# Patient Record
Sex: Male | Born: 2000 | Race: White | Hispanic: No | Marital: Single | Smoking: Never smoker
Health system: Southern US, Community
[De-identification: ages and names within clinical notes are randomized; demographics above are authoritative.]

## PROBLEM LIST (undated history)

## (undated) DIAGNOSIS — F32A Depression, unspecified: Secondary | ICD-10-CM

## (undated) DIAGNOSIS — F419 Anxiety disorder, unspecified: Secondary | ICD-10-CM

## (undated) DIAGNOSIS — Z8781 Personal history of (healed) traumatic fracture: Secondary | ICD-10-CM

## (undated) DIAGNOSIS — F329 Major depressive disorder, single episode, unspecified: Secondary | ICD-10-CM

## (undated) DIAGNOSIS — Z8659 Personal history of other mental and behavioral disorders: Secondary | ICD-10-CM

## (undated) HISTORY — DX: Personal history of other mental and behavioral disorders: Z86.59

## (undated) HISTORY — DX: Depression, unspecified: F32.A

## (undated) HISTORY — DX: Anxiety disorder, unspecified: F41.9

## (undated) HISTORY — DX: Major depressive disorder, single episode, unspecified: F32.9

## (undated) HISTORY — DX: Personal history of (healed) traumatic fracture: Z87.81

---
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
## 2020-01-15 ENCOUNTER — Other Ambulatory Visit: Payer: Self-pay

## 2020-01-15 DIAGNOSIS — E349 Endocrine disorder, unspecified: Secondary | ICD-10-CM

## 2020-01-15 MED ORDER — "NEEDLE (DISP) 18G X 1"" MISC": 0 refills | Status: AC

## 2020-01-20 DIAGNOSIS — Z23 Encounter for immunization: Secondary | ICD-10-CM | POA: Diagnosis not present

## 2020-01-25 ENCOUNTER — Other Ambulatory Visit: Payer: Self-pay | Admitting: *Deleted

## 2020-01-25 DIAGNOSIS — E349 Endocrine disorder, unspecified: Secondary | ICD-10-CM

## 2020-01-25 MED ORDER — BUSPIRONE HCL 10 MG PO TABS: 10.0000 mg | ORAL_TABLET | Freq: Every day | ORAL | 0 refills | Status: DC

## 2020-04-21 ENCOUNTER — Other Ambulatory Visit: Payer: Self-pay

## 2020-04-21 DIAGNOSIS — E349 Endocrine disorder, unspecified: Secondary | ICD-10-CM

## 2020-04-21 MED ORDER — BUSPIRONE HCL 10 MG PO TABS: 10.0000 mg | ORAL_TABLET | Freq: Every day | ORAL | 0 refills | Status: DC

## 2020-07-18 ENCOUNTER — Other Ambulatory Visit: Payer: Self-pay

## 2020-07-18 DIAGNOSIS — E349 Endocrine disorder, unspecified: Secondary | ICD-10-CM

## 2020-07-18 MED ORDER — BUSPIRONE HCL 10 MG PO TABS: 10.0000 mg | ORAL_TABLET | Freq: Every day | ORAL | 0 refills | Status: DC

## 2020-08-05 ENCOUNTER — Other Ambulatory Visit: Payer: Self-pay | Admitting: Osteopathic Medicine

## 2020-08-05 DIAGNOSIS — E349 Endocrine disorder, unspecified: Secondary | ICD-10-CM

## 2020-08-05 DIAGNOSIS — Z789 Other specified health status: Secondary | ICD-10-CM

## 2020-08-08 NOTE — Telephone Encounter (Signed)
Please call patient: Refill sent for testosterone, he is overdue for blood work, orders are in for routine labs, please have him either come in to see our phlebotomist or can go downstairs to [facility]

## 2020-08-09 NOTE — Telephone Encounter (Signed)
Task completed. Patient has been updated regarding rx refill and provider's note. Patient informed of lab hours/schedule.

## 2020-09-16 DIAGNOSIS — E349 Endocrine disorder, unspecified: Secondary | ICD-10-CM | POA: Diagnosis not present

## 2020-09-16 DIAGNOSIS — Z789 Other specified health status: Secondary | ICD-10-CM | POA: Diagnosis not present

## 2020-09-16 LAB — COMPLETE METABOLIC PANEL WITH GFR
AG Ratio: 2 (calc) (ref 1.0–2.5)
ALT: 9 U/L (ref 8–46)
AST: 10 U/L — ABNORMAL LOW (ref 12–32)
Albumin: 4.7 g/dL (ref 3.6–5.1)
Alkaline phosphatase (APISO): 61 U/L (ref 46–169)
BUN: 15 mg/dL (ref 7–20)
CO2: 28 mmol/L (ref 20–32)
Calcium: 9.6 mg/dL (ref 8.9–10.4)
Chloride: 105 mmol/L (ref 98–110)
Creat: 0.97 mg/dL (ref 0.60–1.24)
Globulin: 2.3 g/dL (calc) (ref 2.1–3.5)
Glucose, Bld: 74 mg/dL (ref 65–99)
Potassium: 4.2 mmol/L (ref 3.8–5.1)
Sodium: 139 mmol/L (ref 135–146)
Total Bilirubin: 1.5 mg/dL — ABNORMAL HIGH (ref 0.2–1.1)
Total Protein: 7 g/dL (ref 6.3–8.2)
eGFR: 115 mL/min/{1.73_m2} (ref >=60)

## 2020-09-16 LAB — CBC
HCT: 47.4 % (ref 38.5–50.0)
Hemoglobin: 16.1 g/dL (ref 13.2–17.1)
MCH: 31.1 pg (ref 27.0–33.0)
MCHC: 34 g/dL (ref 32.0–36.0)
MCV: 91.7 fL (ref 80.0–100.0)
MPV: 10.8 fL (ref 7.5–12.5)
Platelets: 221 10*3/uL (ref 140–400)
RBC: 5.17 10*6/uL (ref 4.20–5.80)
RDW: 11.8 % (ref 11.0–15.0)
WBC: 4.1 10*3/uL (ref 3.8–10.8)

## 2020-09-16 LAB — LIPID PANEL
Cholesterol: 188 mg/dL — ABNORMAL HIGH (ref <170)
HDL: 52 mg/dL (ref >45)
LDL Cholesterol (Calc): 119 mg/dL (calc) — ABNORMAL HIGH (ref <110)
Non-HDL Cholesterol (Calc): 136 mg/dL (calc) — ABNORMAL HIGH (ref <120)
Total CHOL/HDL Ratio: 3.6 (calc) (ref <5.0)
Triglycerides: 71 mg/dL (ref <90)

## 2020-09-16 LAB — TESTOSTERONE: Testosterone: 65 ng/dL — ABNORMAL LOW (ref 250–827)

## 2020-09-23 ENCOUNTER — Other Ambulatory Visit: Payer: Self-pay

## 2020-09-23 DIAGNOSIS — Z5181 Encounter for therapeutic drug level monitoring: Secondary | ICD-10-CM

## 2020-09-23 DIAGNOSIS — Z79890 Hormone replacement therapy (postmenopausal): Secondary | ICD-10-CM

## 2020-10-10 DIAGNOSIS — Z5181 Encounter for therapeutic drug level monitoring: Secondary | ICD-10-CM | POA: Diagnosis not present

## 2020-10-10 DIAGNOSIS — Z79890 Hormone replacement therapy (postmenopausal): Secondary | ICD-10-CM | POA: Diagnosis not present

## 2020-10-11 ENCOUNTER — Other Ambulatory Visit: Payer: Self-pay | Admitting: Osteopathic Medicine

## 2020-10-11 DIAGNOSIS — E349 Endocrine disorder, unspecified: Secondary | ICD-10-CM

## 2020-10-11 LAB — TESTOSTERONE: Testosterone: 552 ng/dL (ref 250–827)

## 2020-10-11 MED ORDER — TESTOSTERONE CYPIONATE 200 MG/ML IM SOLN: INTRAMUSCULAR | 3 refills | Status: DC

## 2020-10-13 ENCOUNTER — Ambulatory Visit (INDEPENDENT_AMBULATORY_CARE_PROVIDER_SITE_OTHER): Payer: BC Managed Care – PPO | Admitting: Osteopathic Medicine

## 2020-10-13 VITALS — BP 120/74 | HR 69 | Temp 98.1°F | Wt 138.1 lb

## 2020-10-13 DIAGNOSIS — Z789 Other specified health status: Secondary | ICD-10-CM | POA: Diagnosis not present

## 2020-10-13 DIAGNOSIS — Z0000 Encounter for general adult medical examination without abnormal findings: Secondary | ICD-10-CM

## 2020-10-13 NOTE — Progress Notes (Signed)
0

## 2020-10-13 NOTE — Progress Notes (Signed)
James Glenn is a 20 y.o. adult who presents to  [place] at [facility]  today, 10/13/20, seeking care for the following:  Annual physical  Monitor on gender-affirming hormone Rx - doing well      ASSESSMENT & PLAN with other pertinent findings:  The primary encounter diagnosis was Annual physical exam. 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   1. Annual physical exam See below  2. 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 OK to get labs only in 6 mos: monitor CBC, goal T levels 400-800    Patient Instructions  General Preventive Care Most recent routine screening labs: done.  Blood pressure goal 130/80 or less.  Tobacco: don't!  Alcohol: responsible moderation is ok for adults 21 and over - if you have concerns about alcohol intake, please talk to me!  Exercise: as tolerated to reduce risk of cardiovascular disease and diabetes. Strength training will also prevent osteoporosis.  Mental health: if need for mental health care (medicines, counseling, other), or concerns about moods, please let me know!  Sexual / Reproductive health: if need for STD testing, or if concerns with libido/pain problems, please let me know! If you need to discuss family planning, please let me know!  Advanced Directive: Living Will and/or Healthcare Power of Attorney recommended for all adults, regardless of age or health.  Vaccines Flu vaccine: for almost everyone, every fall.  Shingles vaccine: after age 56.  Pneumonia vaccines: after age 43 Tetanus booster: every 10 years - due 2024 HPV vaccine: all done! COVID vaccine: THANKS for getting your vaccine! :) Cancer screenings  Colon cancer screening: for everyone age 44.  Breast cancer screening: mammogram not  needed after top surgery. If you notice concerning chest symptoms, please let us know!  Cervical cancer screening: Pap starting age 45 and repeat every 3 years if normal. Lung cancer screening: not needed for non-smokers Infection screenings  HIV: recommended screening at least once age 50-65, more often as needed. Gonorrhea/Chlamydia: screening as needed Hepatitis C: recommended once for everyone age 85-02 TB: certain at-risk populations Other Bone Density Test: recommended at age 67 Abdominal Aortic Aneurysm screening not needed for non-smokers   No orders of the defined types were placed in this encounter.   No orders of the defined types were placed in this encounter.    See below for relevant physical exam findings  See below for recent lab and imaging results reviewed  Medications, allergies, PMH, PSH, SocH, Bray reviewed below    Follow-up instructions: Return in about 1 year (around 10/13/2021) for [facility] (OR SOONER IF NEEDED), MYCHART SET TO ALERT YOU TO CALL TO SCHEDULE.                                        Exam:  BP 120/74 (BP Location: Left Arm, Patient Position: Sitting, Cuff Size: Normal)   Pulse 69   Temp 98.1 F (36.7 C) (Oral)   Wt 138 lb 1.9 oz (62.7 kg)   BMI 21.00 kg/m  Constitutional: VS see above. General Appearance: alert, well-developed, well-nourished, NAD Neck: No masses, trachea midline.  Respiratory: Normal respiratory effort. no wheeze, no rhonchi, no rales Cardiovascular: S1/S2 normal, no murmur, no rub/gallop auscultated.  RRR.  Musculoskeletal: Gait normal. Symmetric and independent movement of all extremities Abdominal: non-tender, non-distended, no appreciable organomegaly, neg Murphy's, BS WNLx4 Neurological: Normal balance/coordination. No tremor. Skin: warm, dry, intact.  Psychiatric: Normal judgment/insight. Normal mood and affect. Oriented x3.   Current Meds  Medication Sig    busPIRone (BUSPAR) 10 MG tablet Take 1 tablet (10 mg total) by mouth daily.   NEEDLE, DISP, 18 G 18G X 1" MISC To draw testosterone cypionate   NEEDLE, DISP, 23 G (B-D HYPODERMIC NEEDLE 23GX1.5") 23G X 1-1/2" MISC For IM use with testosterone   Syringe, Disposable, (5-6CC SYRINGE) 6 ML MISC For use w/ IM testosterone   testosterone cypionate (DEPOTESTOSTERONE CYPIONATE) 200 MG/ML injection INJECT 0.5 ML IN THE MUSCLE EVERY 14 DAYS    No Known Allergies  Patient Active Problem List   Diagnosis Date Noted   Elevated blood pressure reading 02/05/2018   Encounter for monitoring testosterone replacement therapy 04/29/2017   Adolescent idiopathic scoliosis of thoracolumbar region 04/29/2017   Endocrine disorder, unspecified 03/25/2017   Acne vulgaris 03/25/2017   Anxiety disorder 03/25/2017    Family History  Problem Relation Age of Onset   Hypertension Father    Hyperlipidemia Maternal Grandfather    Hyperlipidemia Paternal Grandmother     Social History   Tobacco Use  Smoking Status Never  Smokeless Tobacco Never    Past Surgical History:  Procedure Laterality Date   TOTAL MASTECTOMY  02/2017   gender confirmation surgery    Immunization History  Administered Date(s) Administered   DTaP 01/09/2001, 01/09/2001, 03/07/2001, 03/07/2001, 05/06/2001, 05/06/2001, 03/03/2002, 11/07/2005, 11/07/2005   HPV 9-valent 05/10/2015, 05/10/2015   HPV Quadrivalent 01/07/2014   Hepatitis A 11/07/2005, 05/13/2006   Hepatitis A, Ped/Adol-2 Dose 11/07/2005, 05/13/2006   Hepatitis B 2000[birth year]04[birth year]29, 12/10/2000, 07/29/2001   Hepatitis B, ped/adol September 05, 2000, 12/10/2000, 07/29/2001   HiB (PRP-OMP) 01/09/2001, 01/09/2001, 03/07/2001, 03/07/2001, 05/06/2001, 05/06/2001, 11/03/2001, 11/03/2001   Hpv-Unspecified 01/07/2014, 05/10/2015   IPV 01/09/2001, 01/09/2001, 03/07/2001, 03/07/2001, 07/29/2001, 07/29/2001, 05/13/2006, 05/13/2006   Influenza Inj Mdck Quad Pf 12/15/2016, 10/27/2017   MMR  03/03/2002, 03/03/2002, 05/13/2006, 05/13/2006   Meningococcal B, OMV 10/28/2018   Meningococcal Conjugate 07/14/2012, 07/14/2012   Meningococcal Mcv4o 10/24/2018   PFIZER(Purple Top)SARS-COV-2 Vaccination 06/10/2019, 10/01/2019   Pneumococcal Conjugate-13 01/09/2001, 01/09/2001, 03/07/2001, 03/07/2001, 05/06/2001, 05/06/2001, 03/03/2002, 03/03/2002   Td 07/14/2012   Tdap 07/14/2012   Varicella 03/03/2002, 03/03/2002, 05/13/2006   Zoster, Live 05/13/2006    Recent Results (from the past 2160 hour(s))  CBC     Status: None   Collection Time: 09/16/20  9:31 AM  Result Value Ref Range   WBC 4.1 3.8 - 10.8 Thousand/uL   RBC 5.17 4.20 - 5.80 Million/uL   Hemoglobin 16.1 13.2 - 17.1 g/dL   HCT 47.4 38.5 - 50.0 %   MCV 91.7 80.0 - 100.0 fL   MCH 31.1 27.0 - 33.0 pg   MCHC 34.0 32.0 - 36.0 g/dL   RDW 11.8 11.0 - 15.0 %   Platelets 221 140 - 400 Thousand/uL   MPV 10.8 7.5 - 12.5 fL  COMPLETE METABOLIC PANEL WITH GFR     Status: Abnormal   Collection Time: 09/16/20  9:31 AM  Result Value Ref Range   Glucose, Bld 74 65 - 99 mg/dL    Comment: .            Fasting reference interval .    BUN 15 7 - 20 mg/dL   Creat 0.97 0.60 - 1.24 mg/dL   eGFR 115 >  OR = 60 mL/min/1.84m    Comment: The eGFR is based on the CKD-EPI 2021 equation. To calculate  the new eGFR from a previous Creatinine or Cystatin C result, go to https://www.kidney.org/professionals/ kdoqi/gfr%5Fcalculator    BUN/Creatinine Ratio NOT APPLICABLE 6 - 22 (calc)   Sodium 139 135 - 146 mmol/L   Potassium 4.2 3.8 - 5.1 mmol/L   Chloride 105 98 - 110 mmol/L   CO2 28 20 - 32 mmol/L   Calcium 9.6 8.9 - 10.4 mg/dL   Total Protein 7.0 6.3 - 8.2 g/dL   Albumin 4.7 3.6 - 5.1 g/dL   Globulin 2.3 2.1 - 3.5 g/dL (calc)   AG Ratio 2.0 1.0 - 2.5 (calc)   Total Bilirubin 1.5 (H) 0.2 - 1.1 mg/dL   Alkaline phosphatase (APISO) 61 46 - 169 U/L   AST 10 (L) 12 - 32 U/L   ALT 9 8 - 46 U/L  Lipid panel     Status: Abnormal    Collection Time: 09/16/20  9:31 AM  Result Value Ref Range   Cholesterol 188 (H) <170 mg/dL   HDL 52 >45 mg/dL   Triglycerides 71 <90 mg/dL   LDL Cholesterol (Calc) 119 (H) <110 mg/dL (calc)    Comment: LDL-C is now calculated using the Martin-Hopkins  calculation, which is a validated novel method providing  better accuracy than the Friedewald equation in the  estimation of LDL-C.  MCresenciano Genreet al. JAnnamaria Helling [id number];757(97: [phone]  (http://education.[employer].com/faq/FAQ164)    Total CHOL/HDL Ratio 3.6 <5.0 (calc)   Non-HDL Cholesterol (Calc) 136 (H) <120 mg/dL (calc)    Comment: For patients with diabetes plus 1 major ASCVD risk  factor, treating to a non-HDL-C goal of <100 mg/dL  (LDL-C of <70 mg/dL) is considered a therapeutic  option.   Testosterone     Status: Abnormal   Collection Time: 09/16/20  9:31 AM  Result Value Ref Range   Testosterone 65 (L) 250 - 827 ng/dL    Comment: In hypogonadal males, Testosterone, Total, LC/MS/MS, is the recommended assay due to the diminished accuracy of immunoassay at levels below 250 ng/dL. This test code [phone] must be collected in a red-top tube with no gel.    Testosterone     Status: None   Collection Time: 10/10/20 12:00 AM  Result Value Ref Range   Testosterone 552 250 - 827 ng/dL    No results found.     All questions at time of visit were answered - patient instructed to contact office with any additional concerns or updates. ER/RTC precautions were reviewed with the patient as applicable.   Please note: manual typing as well as voice recognition software may have been used to produce this document - typos may escape review. Please contact Dr. ASheppard Coilfor any needed clarifications.

## 2020-10-13 NOTE — Patient Instructions (Addendum)
General Preventive Care Most recent routine screening labs: done.  Blood pressure goal 130/80 or less.  Tobacco: don't!  Alcohol: responsible moderation is ok for adults 21 and over - if you have concerns about alcohol intake, please talk to me!  Exercise: as tolerated to reduce risk of cardiovascular disease and diabetes. Strength training will also prevent osteoporosis.  Mental health: if need for mental health care (medicines, counseling, other), or concerns about moods, please let me know!  Sexual / Reproductive health: if need for STD testing, or if concerns with libido/pain problems, please let me know! If you need to discuss family planning, please let me know!  Advanced Directive: Living Will and/or Healthcare Power of Attorney recommended for all adults, regardless of age or health.  Vaccines Flu vaccine: for almost everyone, every fall.  Shingles vaccine: after age 75.  Pneumonia vaccines: after age 76 Tetanus booster: every 10 years - due 2024 HPV vaccine: all done! COVID vaccine: THANKS for getting your vaccine! :) Cancer screenings  Colon cancer screening: for everyone age 60.  Breast cancer screening: mammogram not needed after top surgery. If you notice concerning chest symptoms, please let us know!  Cervical cancer screening: Pap starting age 67 and repeat every 3 years if normal. Lung cancer screening: not needed for non-smokers Infection screenings  HIV: recommended screening at least once age 75-65, more often as needed. Gonorrhea/Chlamydia: screening as needed Hepatitis C: recommended once for everyone age 58-75 TB: certain at-risk populations Other Bone Density Test: recommended at age 85 Abdominal Aortic Aneurysm screening not needed for non-smokers

## 2020-10-14 ENCOUNTER — Encounter: Payer: Self-pay | Admitting: Osteopathic Medicine

## 2021-05-08 ENCOUNTER — Other Ambulatory Visit: Payer: Self-pay

## 2021-05-08 DIAGNOSIS — E349 Endocrine disorder, unspecified: Secondary | ICD-10-CM

## 2021-05-08 MED ORDER — TESTOSTERONE CYPIONATE 200 MG/ML IM SOLN: INTRAMUSCULAR | 1 refills | Status: DC

## 2021-05-08 NOTE — Telephone Encounter (Signed)
Routing to covering provider. [facility] pharmacy requesting med refill for testosterone.  ?

## 2021-07-09 NOTE — Progress Notes (Signed)
?  HPI with pertinent ROS:  ? ?CC: transfer of care, labs ? ?HPI: ?Pleasant 21 year old FTM transgender patient presenting today to transfer care to a new PCP. Has been taking testosterone 100mg  IM every 2 weeks. Been on this for several years. Recently missed a couple of doses due to running out of his supply. With the recent doses missed, menses restarted late last year but has now stopped again.  Has been back on testosterone since late December and is doing very well overall.  Due for labs. ? ?Has questions about recommendations for Pap smear and notes that he has been haunted by this topic for a while. ? ?I reviewed the past medical history, family history, social history, surgical history, and allergies today and no changes were needed.  Please see the problem list section below in epic for further details. ? ? ?Physical exam:  ? ?General: Well Developed, well nourished, and in no acute distress.  ?Neuro: Alert and oriented x3.  ?HEENT: Normocephalic, atraumatic.  ?Skin: Warm and dry. ?Cardiac: Regular rate and rhythm, no murmurs rubs or gallops, no lower extremity edema.  ?Respiratory: Clear to auscultation bilaterally. Not using accessory muscles, speaking in full sentences. ? ?Impression and Recommendations:   ? ?1. 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 Endocrine disorder, unspecified ?Checking labs as below.  Recommend coming in on Friday, 07/14/2021 to make sure we have an accurate evaluation of testosterone.  For now, continue testosterone 100 mg every 2 weeks as prescribed.  Discussed the recommendations for cervical cancer screening in transgender patients.  Described the process and allowed patient to evaluate speculum and equipment used.  All questions answered. ?- CBC ?- COMPLETE METABOLIC PANEL WITH GFR ?- Testosterone ?- Lipid panel ?- testosterone cypionate (DEPOTESTOSTERONE CYPIONATE) 200 MG/ML injection; INJECT 0.5 ML IN THE  MUSCLE EVERY 14 DAYS  Dispense: 4 mL; Refill: 0 ? ?Return in about 6 months (around 01/10/2022) for HRT follow up. ?___________________________________________ ?01/12/2022, DNP, APRN, FNP-BC ?Primary Care and Sports Medicine ?[facility] Thayer Ohm ?

## 2021-07-10 ENCOUNTER — Ambulatory Visit: Payer: BC Managed Care – PPO | Admitting: Medical-Surgical

## 2021-07-10 ENCOUNTER — Encounter: Payer: Self-pay | Admitting: Medical-Surgical

## 2021-07-10 VITALS — BP 119/83 | HR 66 | Resp 16 | Ht 68.0 in | Wt 138.0 lb

## 2021-07-10 DIAGNOSIS — E349 Endocrine disorder, unspecified: Secondary | ICD-10-CM | POA: Diagnosis not present

## 2021-07-10 DIAGNOSIS — Z789 Other specified health status: Secondary | ICD-10-CM | POA: Diagnosis not present

## 2021-07-10 MED ORDER — TESTOSTERONE CYPIONATE 200 MG/ML IM SOLN: INTRAMUSCULAR | 0 refills | Status: DC

## 2021-07-14 DIAGNOSIS — Z789 Other specified health status: Secondary | ICD-10-CM | POA: Diagnosis not present

## 2021-07-15 LAB — COMPLETE METABOLIC PANEL WITH GFR
AG Ratio: 2.2 (calc) (ref 1.0–2.5)
ALT: 9 U/L (ref 9–46)
AST: 9 U/L — ABNORMAL LOW (ref 10–40)
Albumin: 4.8 g/dL (ref 3.6–5.1)
Alkaline phosphatase (APISO): 57 U/L (ref 36–130)
BUN: 13 mg/dL (ref 7–25)
CO2: 27 mmol/L (ref 20–32)
Calcium: 9.8 mg/dL (ref 8.6–10.3)
Chloride: 103 mmol/L (ref 98–110)
Creat: 0.94 mg/dL (ref 0.60–1.24)
Globulin: 2.2 g/dL (calc) (ref 1.9–3.7)
Glucose, Bld: 79 mg/dL (ref 65–99)
Potassium: 4.5 mmol/L (ref 3.5–5.3)
Sodium: 140 mmol/L (ref 135–146)
Total Bilirubin: 1.1 mg/dL (ref 0.2–1.2)
Total Protein: 7 g/dL (ref 6.1–8.1)
eGFR: 119 mL/min/{1.73_m2} (ref >=60)

## 2021-07-15 LAB — LIPID PANEL
Cholesterol: 199 mg/dL (ref <200)
HDL: 58 mg/dL (ref >=40)
LDL Cholesterol (Calc): 124 mg/dL (calc) — ABNORMAL HIGH
Non-HDL Cholesterol (Calc): 141 mg/dL (calc) — ABNORMAL HIGH (ref <130)
Total CHOL/HDL Ratio: 3.4 (calc) (ref <5.0)
Triglycerides: 74 mg/dL (ref <150)

## 2021-07-15 LAB — CBC
HCT: 46.9 % (ref 38.5–50.0)
Hemoglobin: 16.1 g/dL (ref 13.2–17.1)
MCH: 31.6 pg (ref 27.0–33.0)
MCHC: 34.3 g/dL (ref 32.0–36.0)
MCV: 92 fL (ref 80.0–100.0)
MPV: 10.6 fL (ref 7.5–12.5)
Platelets: 243 10*3/uL (ref 140–400)
RBC: 5.1 10*6/uL (ref 4.20–5.80)
RDW: 12.1 % (ref 11.0–15.0)
WBC: 5.2 10*3/uL (ref 3.8–10.8)

## 2021-07-15 LAB — TESTOSTERONE: Testosterone: 386 ng/dL (ref 250–827)

## 2021-11-11 ENCOUNTER — Other Ambulatory Visit: Payer: Self-pay | Admitting: Medical-Surgical

## 2021-11-11 DIAGNOSIS — E349 Endocrine disorder, unspecified: Secondary | ICD-10-CM

## 2021-11-13 ENCOUNTER — Other Ambulatory Visit: Payer: Self-pay

## 2021-11-13 DIAGNOSIS — E349 Endocrine disorder, unspecified: Secondary | ICD-10-CM

## 2021-11-13 MED ORDER — TESTOSTERONE CYPIONATE 200 MG/ML IM SOLN: INTRAMUSCULAR | 0 refills | Status: DC

## 2021-11-23 IMAGING — DX DG SCOLIOSIS EVAL COMPLETE SPINE 2-3V
2 series · 8 of 8 positions shown · non-contrast
Comparison: 10/24/2018

CLINICAL DATA: Follow-up scoliosis

EXAM:
DG SCOLIOSIS EVAL COMPLETE SPINE 2-3V

[Series 1: whole body ap · 0.14mm/px · 4 of 4 slices shown]
[im 1/4]
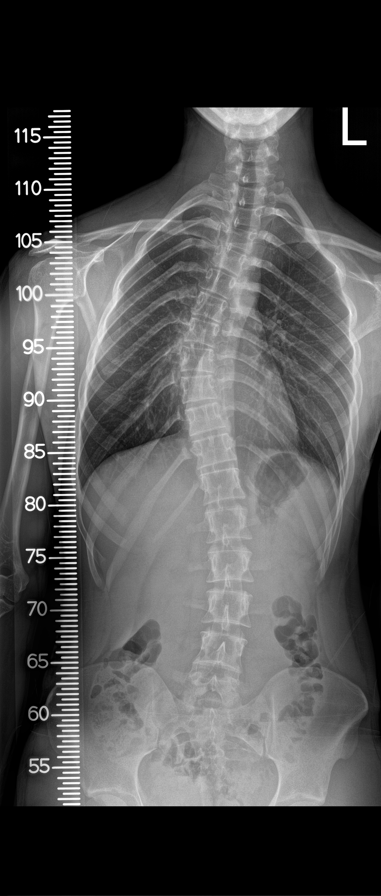
[im 2/4]
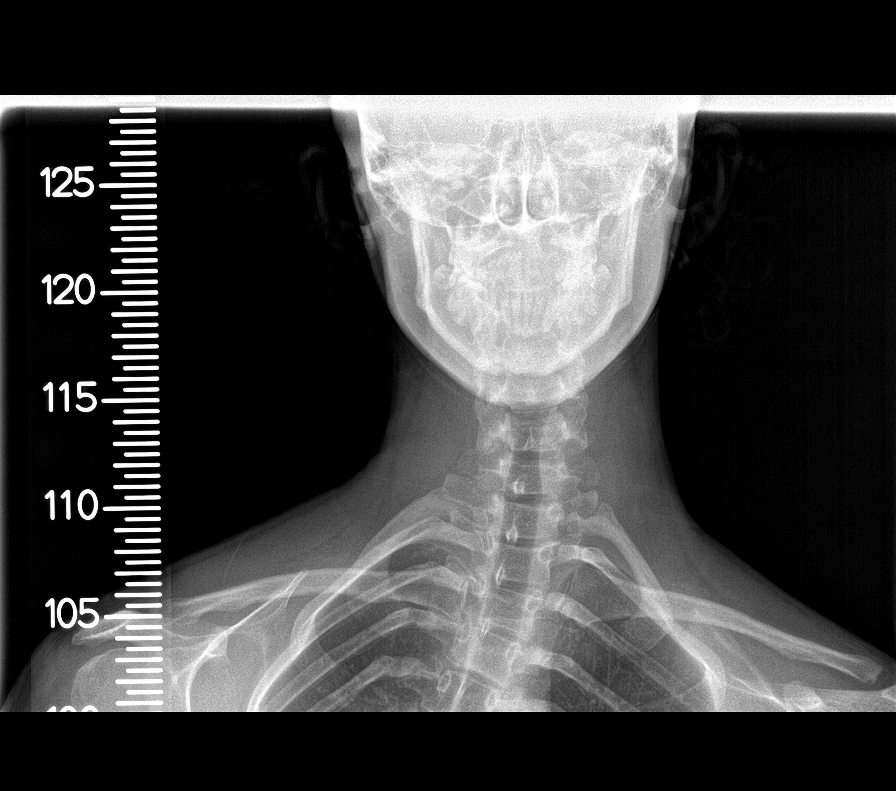
[im 3/4]
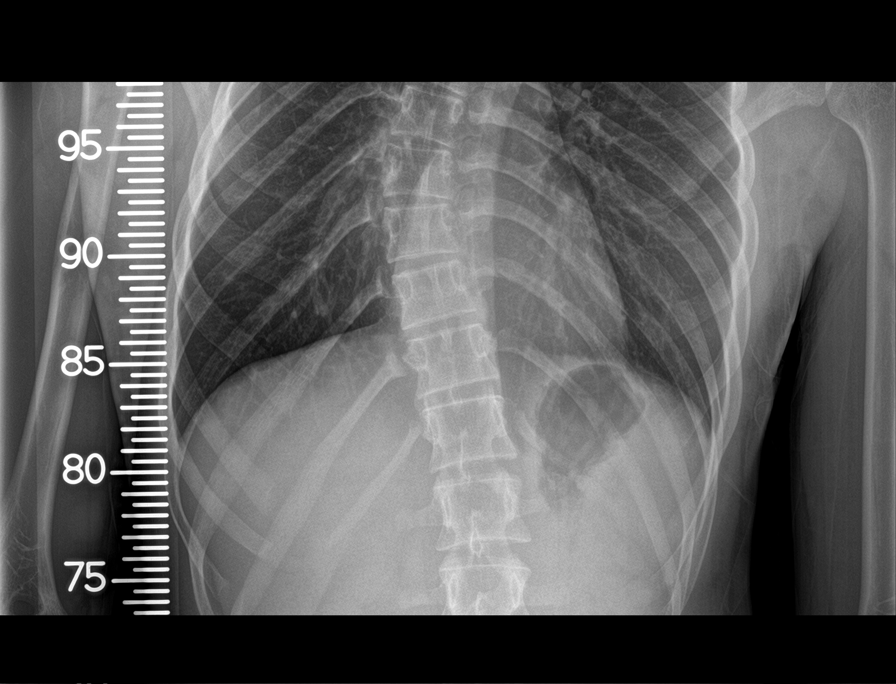
[im 4/4]
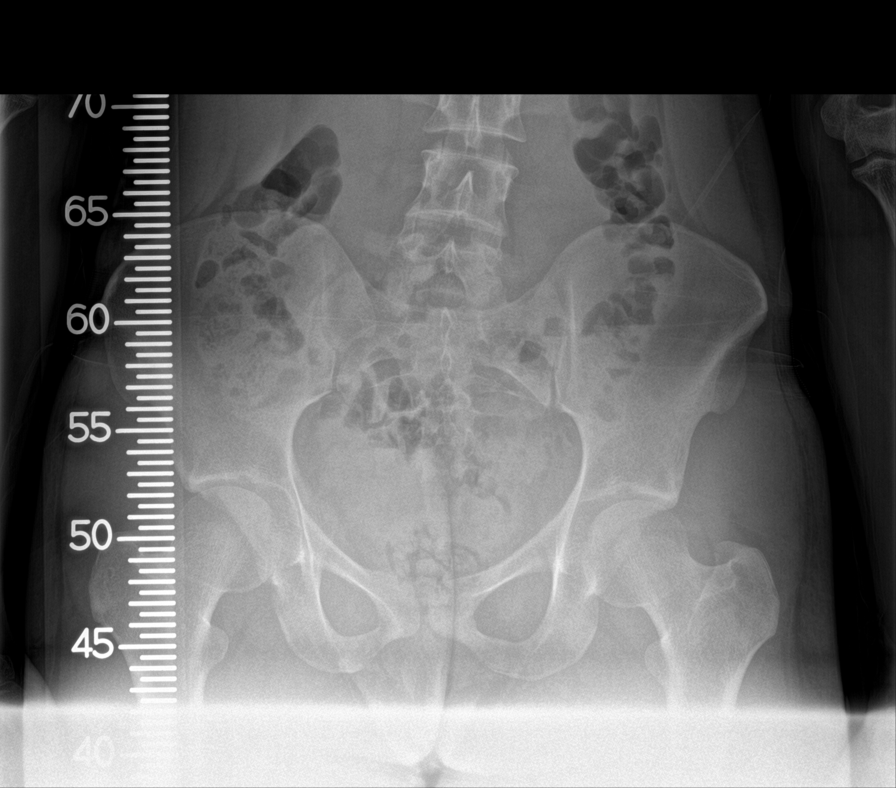

[Series 2: whole body lat · 0.14mm/px · 4 of 4 slices shown]
[im 1/4]
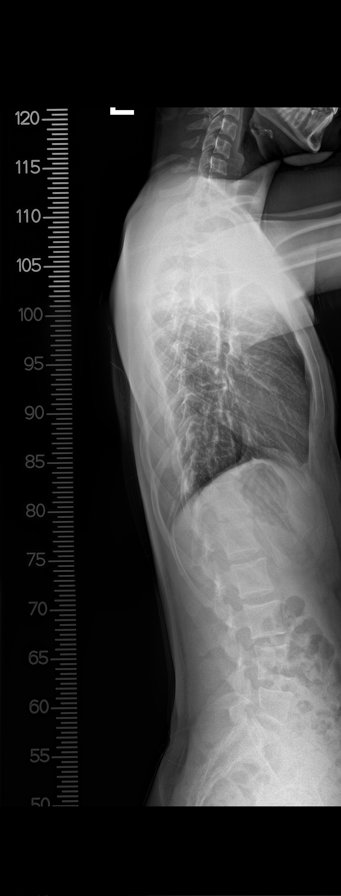
[im 2/4]
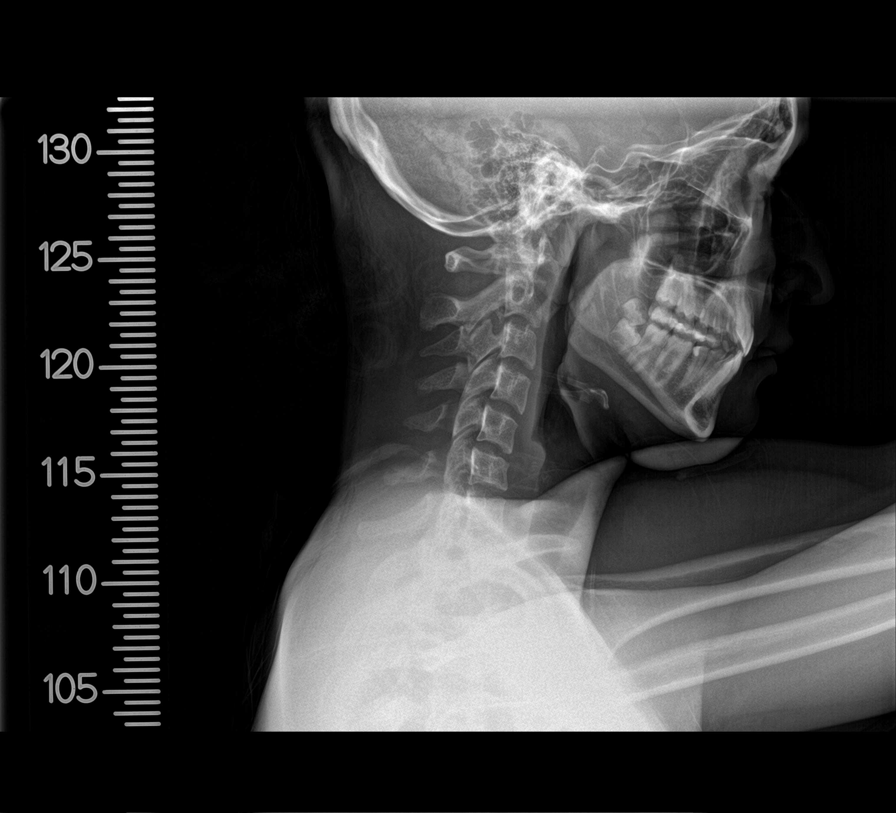
[im 3/4]
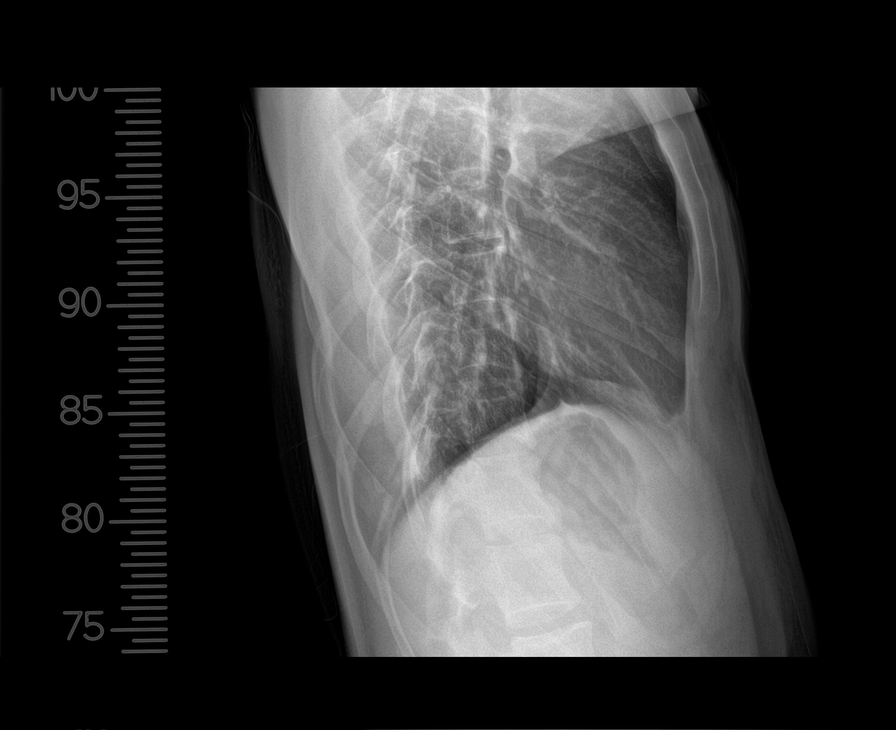
[im 4/4]
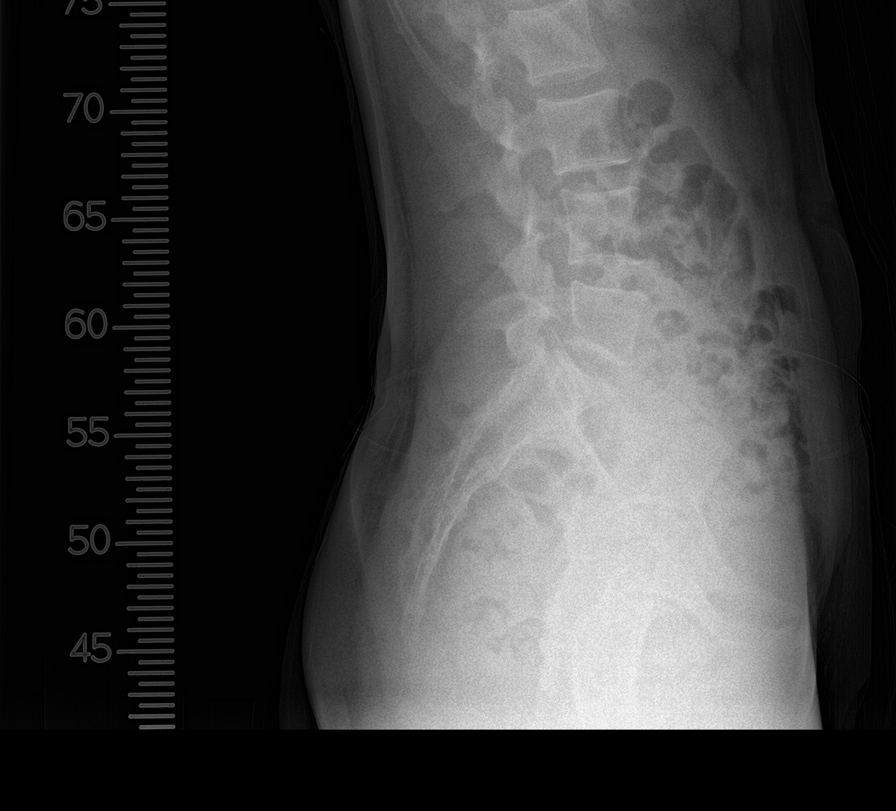

[8 of 8 positions shown; findings below may reference images not displayed]

FINDINGS: 37 degree scoliosis of the thoracic spine, apex right, from superior
endplate of T4 to the inferior endplate of T11, previously 32
degrees when measured in a similar fashion. 24 degree scoliosis of
the thoracolumbar spine, apex left, as measured from superior
endplate of T11 to inferior endplate of L4, previously 26 degrees
when measured in a similar fashion. Twelve rib pairs. No congenital
vertebral anomalies. Patient is Risser class 5. Sagittal images
demonstrate mild reversal of lower cervical lordosis, straightening
of the thoracic spine, and mild lumbar lordosis. Vertebral body
heights and disc spaces appear normal.
IMPRESSION: Thoracolumbar scoliosis as above.

## 2022-01-14 NOTE — Progress Notes (Unsigned)
   Established Patient Office Visit  Subjective   Patient ID: Corky Blumstein, transgender male   DOB: 2001-01-18 Age: 21 y.o. MRN: [id number]   No chief complaint on file.   HPI    Objective:    There were no vitals filed for this visit.  Physical Exam   No results found for this or any previous visit (from the past 24 hour(s)).   {Labs (Optional):23779}  The ASCVD Risk score (Arnett DK, et al., 2019) failed to calculate for the following reasons:   The 2019 ASCVD risk score is only valid for ages 45 to 91   Assessment & Plan:   No problem-specific Assessment & Plan notes found for this encounter.   No follow-ups on file.  ___________________________________________ Th[facility] Ohm, DNP, APRN, FNP-BC Primary Care and Sports Medicine [facility] [place]

## 2022-01-15 ENCOUNTER — Ambulatory Visit: Payer: BC Managed Care – PPO | Admitting: Medical-Surgical

## 2022-01-15 ENCOUNTER — Encounter: Payer: Self-pay | Admitting: Medical-Surgical

## 2022-01-15 VITALS — BP 100/69 | HR 88 | Ht 68.0 in | Wt 138.5 lb

## 2022-01-15 DIAGNOSIS — Z789 Other specified health status: Secondary | ICD-10-CM | POA: Diagnosis not present

## 2022-01-15 DIAGNOSIS — Z23 Encounter for immunization: Secondary | ICD-10-CM

## 2022-01-16 ENCOUNTER — Telehealth: Payer: Self-pay | Admitting: Medical-Surgical

## 2022-01-16 LAB — CBC
HCT: 45.1 % (ref 38.5–50.0)
Hemoglobin: 15.7 g/dL (ref 13.2–17.1)
MCH: 30.9 pg (ref 27.0–33.0)
MCHC: 34.8 g/dL (ref 32.0–36.0)
MCV: 88.8 fL (ref 80.0–100.0)
MPV: 10.3 fL (ref 7.5–12.5)
Platelets: 273 10*3/uL (ref 140–400)
RBC: 5.08 10*6/uL (ref 4.20–5.80)
RDW: 12 % (ref 11.0–15.0)
WBC: 7.9 10*3/uL (ref 3.8–10.8)

## 2022-01-16 LAB — TESTOSTERONE: Testosterone: 420 ng/dL (ref 250–827)

## 2022-01-16 NOTE — Telephone Encounter (Signed)
Patient has been scheduled for his 63-month follow-up with the incorrect provider.  Please reschedule with his PCP. Thanks, Ander Slade

## 2022-01-16 NOTE — Telephone Encounter (Signed)
Rescheduled patient with PCP for same day at a later time. Katha Hamming

## 2022-03-28 ENCOUNTER — Other Ambulatory Visit: Payer: Self-pay | Admitting: Medical-Surgical

## 2022-03-28 DIAGNOSIS — E349 Endocrine disorder, unspecified: Secondary | ICD-10-CM

## 2022-03-28 NOTE — Telephone Encounter (Signed)
Last OV: 01/15/22 Next OV:07/16/22 Last labs: 01/15/22 (wnl) Last RF: 11/13/21

## 2022-05-08 IMAGING — DX DG SCOLIOSIS EVAL COMPLETE SPINE 2-3V
2 series · 8 of 8 positions shown · non-contrast
Comparison: 04/24/2019

CLINICAL DATA: Scoliosis evaluation.

EXAM:
DG SCOLIOSIS EVAL COMPLETE SPINE 2-3V

[Series 2: whole body lat · 0.14mm/px · 4 of 4 slices shown]
[im 1/4]
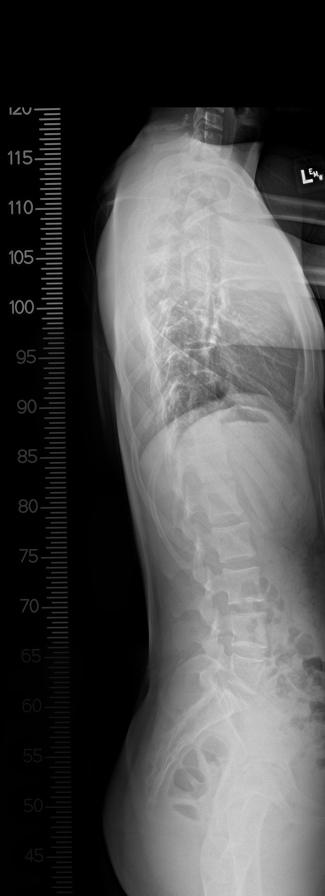
[im 2/4]
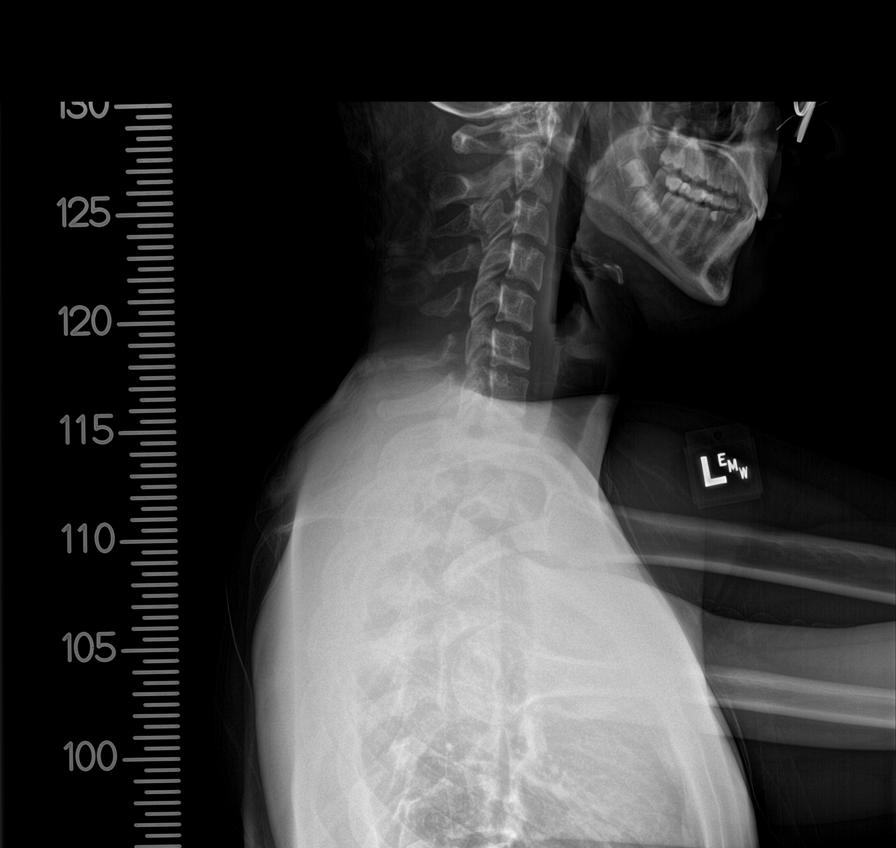
[im 3/4]
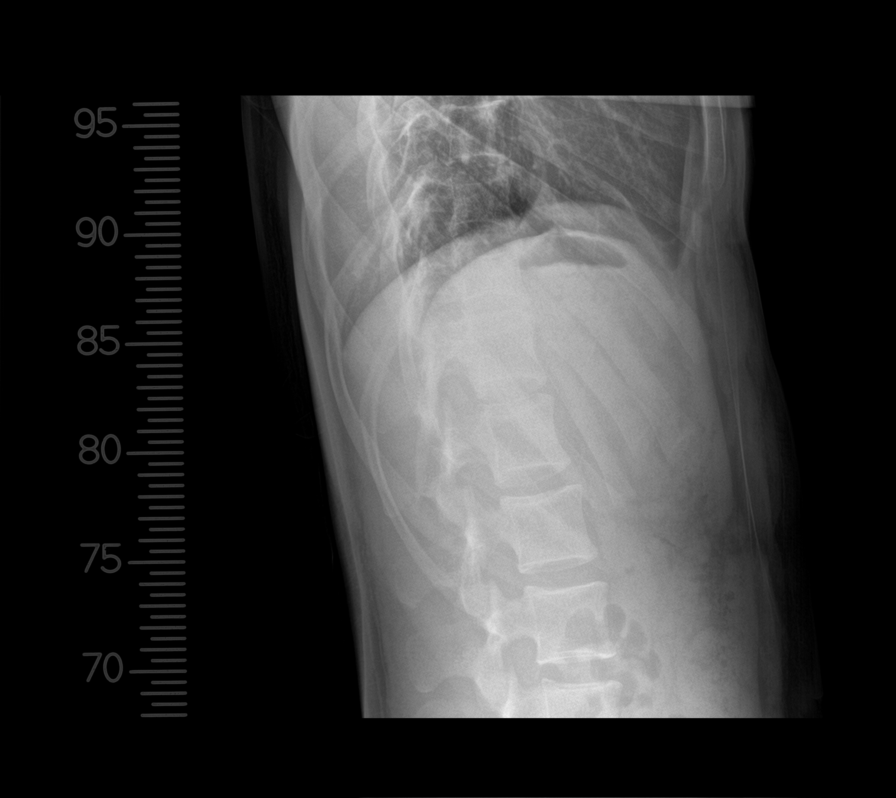
[im 4/4]
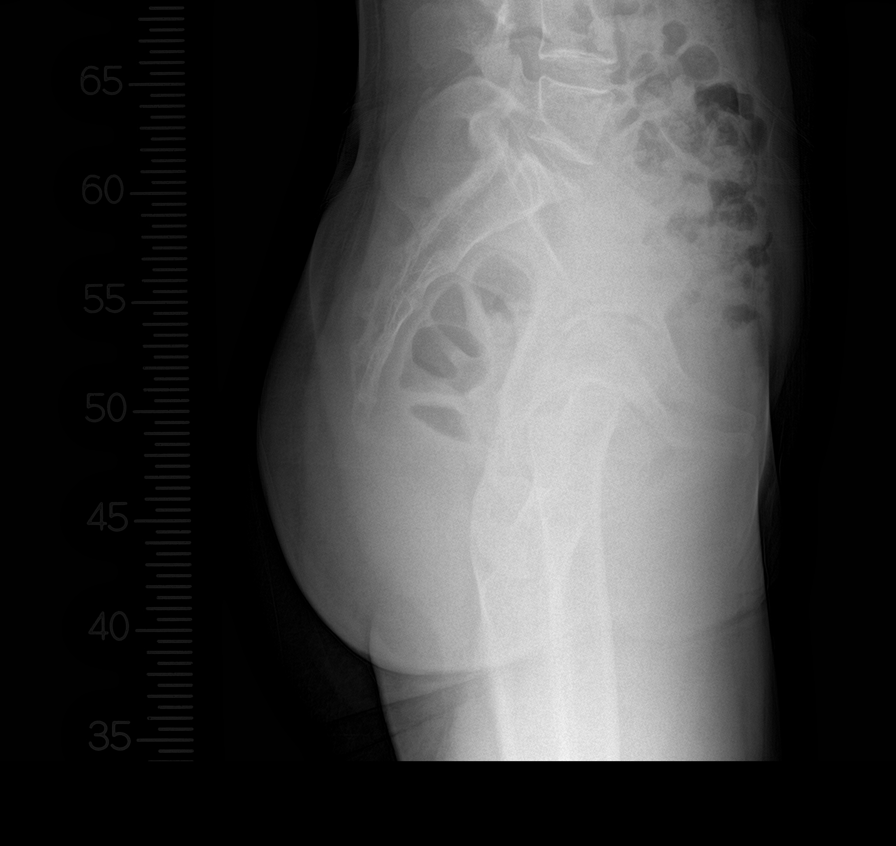

[Series 3: whole body ap · 0.14mm/px · 4 of 4 slices shown]
[im 1/4]
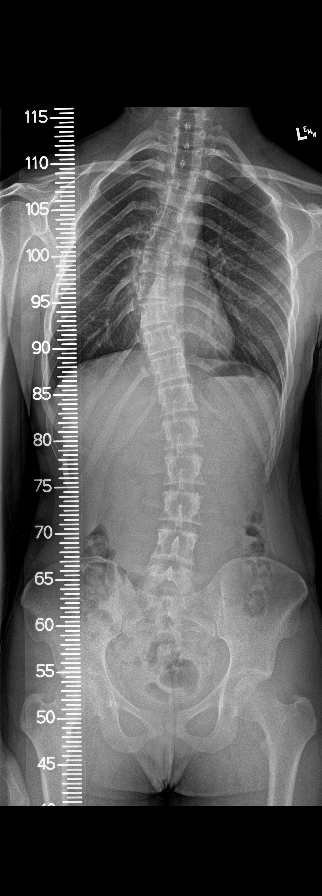
[im 2/4]
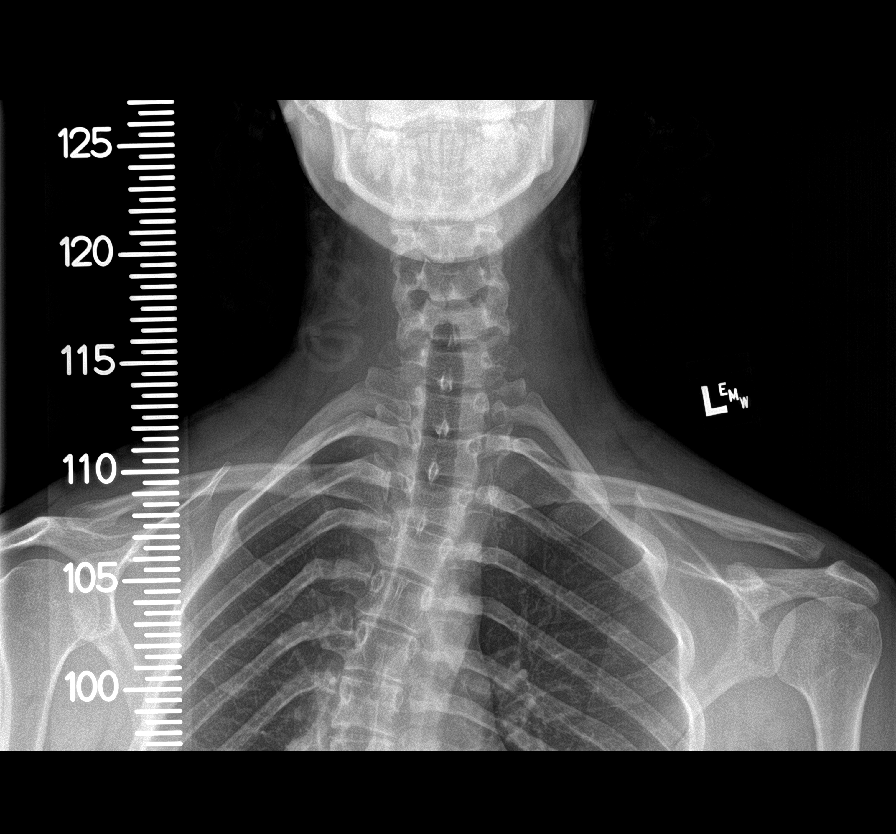
[im 3/4]
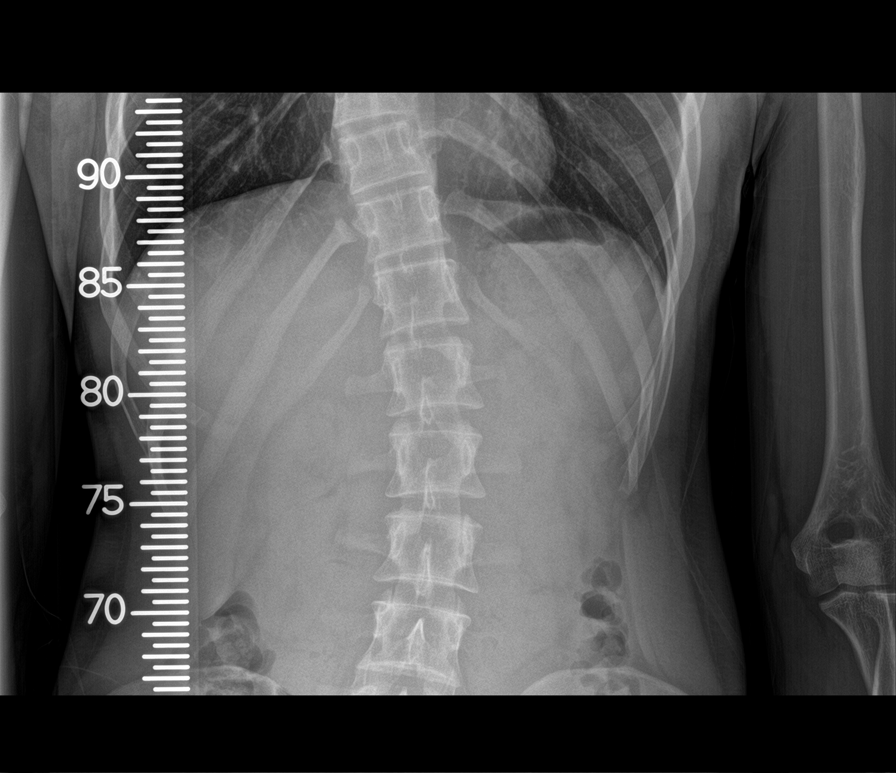
[im 4/4]
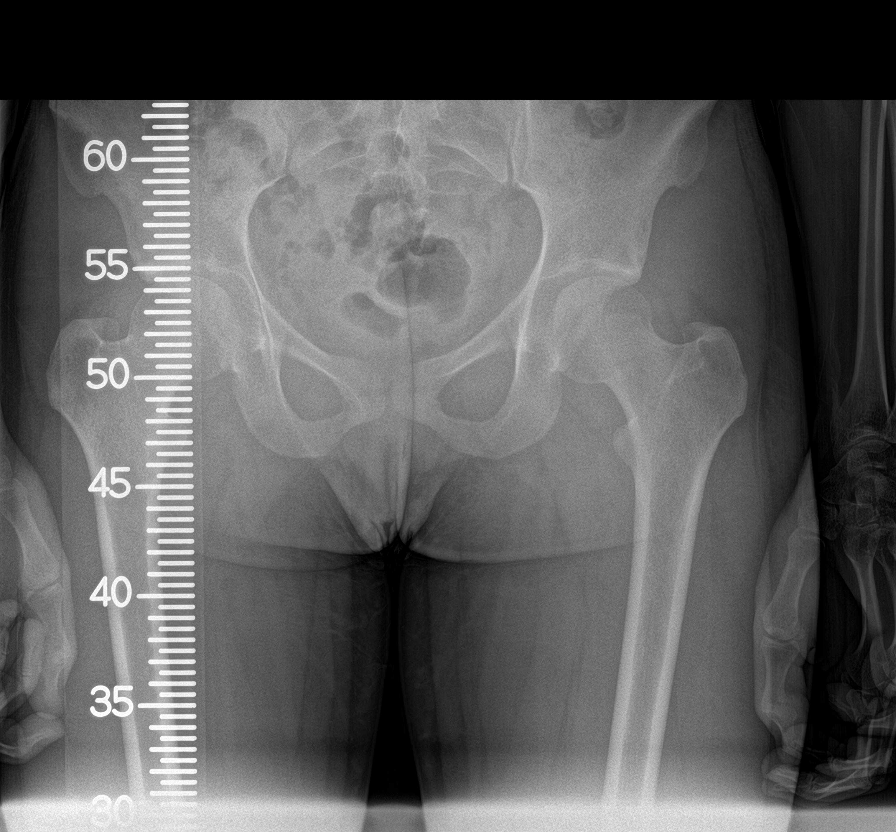

[8 of 8 positions shown; findings below may reference images not displayed]

FINDINGS: Biphasic curvature of the thoracolumbar spine with primary curvature
of the thoracic spine convex right. Angle of curvature of the
thoracic spine from superior endplate T4 to inferior endplate T11 is
33 degrees (previously 37 degrees). Secondary curvature of the
lumbar spine convex left with angle of curvature from T11 to L4 of
23 degrees (previously 24 degrees). Vertebral body heights and disc
spaces are normal. Pedicles are intact. Remainder the exam is
unchanged.
IMPRESSION: Biphasic curvature of the thoracolumbar spine with primary curvature
of the thoracic spine convex right with angle of curvature 33
degrees. Secondary curvature of the lumbar spine convex left with
angle of curvature 23 degrees.

## 2022-07-16 ENCOUNTER — Other Ambulatory Visit (HOSPITAL_COMMUNITY)
Admission: RE | Admit: 2022-07-16 | Discharge: 2022-07-16 | Disposition: A | Discharge status: Home / Self Care | Payer: BC Managed Care – PPO | Source: Ambulatory Visit | Attending: Family Medicine | Admitting: Family Medicine

## 2022-07-16 ENCOUNTER — Ambulatory Visit: Payer: BC Managed Care – PPO | Admitting: Medical-Surgical

## 2022-07-16 ENCOUNTER — Ambulatory Visit: Payer: BC Managed Care – PPO | Admitting: Family Medicine

## 2022-07-16 VITALS — BP 134/70 | HR 65 | Resp 20 | Ht 68.0 in | Wt 140.9 lb

## 2022-07-16 DIAGNOSIS — E349 Endocrine disorder, unspecified: Secondary | ICD-10-CM | POA: Diagnosis not present

## 2022-07-16 DIAGNOSIS — E785 Hyperlipidemia, unspecified: Secondary | ICD-10-CM | POA: Diagnosis not present

## 2022-07-16 DIAGNOSIS — Z124 Encounter for screening for malignant neoplasm of cervix: Secondary | ICD-10-CM

## 2022-07-16 MED ORDER — TESTOSTERONE CYPIONATE 200 MG/ML IM SOLN: INTRAMUSCULAR | 4 refills | Status: DC

## 2022-07-16 NOTE — Progress Notes (Signed)
        Established patient visit  History, exam, impression, and plan:  1. Endocrine disorder, unspecified Pleasant 22 year old transgender male to male presenting today for follow-up on hormone replacement therapy.  Currently using testosterone 100 mg every 14 days IM as prescribed, tolerating well without side effects.  Is very happy with the results he has experienced so far.  No concerning side effects.  Checking labs as below.  Continue testosterone as prescribed, titrate depending on results. - testosterone cypionate (DEPOTESTOSTERONE CYPIONATE) 200 MG/ML injection; INJECT 0.5ML INTO THE MUSCLE EVERY 14 DAYS  Dispense: 2 mL; Refill: 4 - CBC - Lipid panel - Testosterone  2. Cervical cancer screening Procedure discussed and all questions answered.  Patient placed in lithotomy position.  No lesions, rashes, or injuries to the external vaginal areas.  Small speculum inserted into the vaginal vault without difficulty.  Cervix visualized and Pap smear sample collected.  Pap smear completed today with reflex HPV if ASCUS.  If normal, will be due for repeat in 3 years. - Cytology - PAP  Procedures performed this visit: None.  Return in about 6 months (around 01/16/2023) for transgender care.  __________________________________ Thayer Ohm, DNP, APRN, FNP-BC Primary Care and Sports Medicine [facility]

## 2022-07-17 LAB — CBC
HCT: 46.1 % (ref 38.5–50.0)
Hemoglobin: 15.9 g/dL (ref 13.2–17.1)
MCH: 30.8 pg (ref 27.0–33.0)
MCHC: 34.5 g/dL (ref 32.0–36.0)
MCV: 89.3 fL (ref 80.0–100.0)
MPV: 10.5 fL (ref 7.5–12.5)
Platelets: 246 10*3/uL (ref 140–400)
RBC: 5.16 10*6/uL (ref 4.20–5.80)
RDW: 11.8 % (ref 11.0–15.0)
WBC: 6.2 10*3/uL (ref 3.8–10.8)

## 2022-07-17 LAB — LIPID PANEL
Cholesterol: 203 mg/dL — ABNORMAL HIGH (ref <200)
HDL: 59 mg/dL (ref >=40)
LDL Cholesterol (Calc): 126 mg/dL (calc) — ABNORMAL HIGH
Non-HDL Cholesterol (Calc): 144 mg/dL (calc) — ABNORMAL HIGH (ref <130)
Total CHOL/HDL Ratio: 3.4 (calc) (ref <5.0)
Triglycerides: 83 mg/dL (ref <150)

## 2022-07-17 LAB — TESTOSTERONE: Testosterone: 490 ng/dL (ref 250–827)

## 2022-07-19 LAB — CYTOLOGY - PAP: Diagnosis: NEGATIVE

## 2023-01-18 ENCOUNTER — Encounter: Payer: Self-pay | Admitting: Medical-Surgical

## 2023-01-18 ENCOUNTER — Ambulatory Visit: Payer: BC Managed Care – PPO | Admitting: Medical-Surgical

## 2023-01-18 VITALS — BP 123/82 | HR 59 | Resp 20 | Ht 68.0 in | Wt 136.8 lb

## 2023-01-18 DIAGNOSIS — Z23 Encounter for immunization: Secondary | ICD-10-CM | POA: Diagnosis not present

## 2023-01-18 DIAGNOSIS — E349 Endocrine disorder, unspecified: Secondary | ICD-10-CM

## 2023-01-18 DIAGNOSIS — Z789 Other specified health status: Secondary | ICD-10-CM | POA: Diagnosis not present

## 2023-01-18 MED ORDER — TESTOSTERONE CYPIONATE 200 MG/ML IM SOLN: INTRAMUSCULAR | 4 refills | Status: DC

## 2023-01-18 NOTE — Progress Notes (Unsigned)
Established patient visit  History, exam, impression, and plan:  1. Cough, unspecified type 2. Sore throat Pleasant 22 year old male presenting today with reports of upper respiratory symptoms.  Notes that this started approximately 5 days ago with her nose and eyes burning.  On Saturday, she felt unwell and by Sunday she notes that she felt awful.  Her throat has been sore and she has been coughing.  Notes that her cough has been occasionally productive of small amounts of pink-tinged sputum, usually in the morning.  Her left ear has now developed pressure/discomfort and she has significant sinus congestion.  She has tried drinking hot tea and increasing her fluid consumption.  She has also used Chloraseptic for the sore throat.  Continues to have significant issues with hoarseness.  She saw ENT and they recommended just using Atrovent nasal spray twice daily and follow-up with them after a couple of months.  POCT strep, flu, and COVID testing all negative today.  Below for physical exam. - POCT rapid strep A - POCT Influenza A/B - POC COVID-19  3. Viral URI with cough Despite negative testing, suspect that her symptoms are truly related to a viral URI.  Discussed the timeline for resolution of a viral illness since symptoms can last 7 to 14 days.  With her severe hoarseness and significant sinus congestion, treating with Decadron 4 mg twice daily.  Adding Tussionex for cough suppression.  Okay to use Tylenol/ibuprofen for fever/discomfort.  Continue conservative measures at home.  If improvement in symptoms not noted over the next 2 to 3 days or symptoms improved but quickly worsen again, consider adding antibiotic therapy for secondary bacterial infection.   Procedures performed this visit: None.  Return if symptoms worsen or fail to improve.  __________________________________ Thayer Ohm, DNP, APRN, FNP-BC Primary Care and Sports Medicine [facility]

## 2023-01-19 LAB — TESTOSTERONE: Testosterone: 465 ng/dL (ref 264–916)

## 2023-01-20 ENCOUNTER — Encounter: Payer: Self-pay | Admitting: Medical-Surgical

## 2023-01-20 DIAGNOSIS — Z789 Other specified health status: Secondary | ICD-10-CM | POA: Insufficient documentation

## 2023-01-21 ENCOUNTER — Ambulatory Visit: Payer: BC Managed Care – PPO | Admitting: Medical-Surgical

## 2023-07-23 ENCOUNTER — Ambulatory Visit: Payer: BC Managed Care – PPO | Admitting: Medical-Surgical

## 2023-07-23 VITALS — BP 123/76 | HR 75 | Resp 20 | Ht 68.0 in | Wt 143.1 lb

## 2023-07-23 DIAGNOSIS — Z789 Other specified health status: Secondary | ICD-10-CM

## 2023-07-23 DIAGNOSIS — Z23 Encounter for immunization: Secondary | ICD-10-CM | POA: Diagnosis not present

## 2023-07-23 MED ORDER — TESTOSTERONE CYPIONATE 200 MG/ML IM SOLN: INTRAMUSCULAR | 5 refills | Status: DC

## 2023-07-23 NOTE — Progress Notes (Signed)
        Established patient visit  History, exam, impression, and plan:  1. 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 Taking testosterone  100mg  IM every 2 weeks, tolerating well w/o SE. Happy with results of the medication. Stable levels over the last year or two. Has already had bilateral mastectomy and is interested in hysterectomy as he is terrified of the idea of becoming pregnant. No menstrual bleeding. Plan to check labs on the middle day between injections. Continue testosterone  100mg  IM q 14 days.  - Testosterone  - CBC - CMP14+EGFR - Lipid panel - testosterone  cypionate (DEPOTESTOSTERONE CYPIONATE) 200 MG/ML injection; INJECT 0.5ML INTO THE MUSCLE EVERY 14 DAYS  Dispense: 2 mL; Refill: 5  2. Need for meningococcal vaccination Meningitis B vaccine given in office today.  - Meningococcal B, OMV (Bexsero)   Procedures performed this visit: None.  Return in about 6 months (around 01/23/2024) for HRT follow up.  __________________________________ Maryl Snook, DNP, APRN, FNP-BC Primary Care and Sports Medicine [facility] [place]

## 2023-07-26 DIAGNOSIS — Z789 Other specified health status: Secondary | ICD-10-CM | POA: Diagnosis not present

## 2023-07-27 ENCOUNTER — Ambulatory Visit: Payer: Self-pay | Admitting: Medical-Surgical

## 2023-07-27 LAB — CMP14+EGFR
ALT: 12 IU/L (ref 0–44)
AST: 11 IU/L (ref 0–40)
Albumin: 4.7 g/dL (ref 4.3–5.2)
Alkaline Phosphatase: 68 IU/L (ref 44–121)
BUN/Creatinine Ratio: 13 (ref 9–20)
BUN: 13 mg/dL (ref 6–20)
Bilirubin Total: 0.8 mg/dL (ref 0.0–1.2)
CO2: 23 mmol/L (ref 20–29)
Calcium: 9.7 mg/dL (ref 8.7–10.2)
Chloride: 103 mmol/L (ref 96–106)
Creatinine, Ser: 1.01 mg/dL (ref 0.76–1.27)
Globulin, Total: 2 g/dL (ref 1.5–4.5)
Glucose: 87 mg/dL (ref 70–99)
Potassium: 4.6 mmol/L (ref 3.5–5.2)
Sodium: 141 mmol/L (ref 134–144)
Total Protein: 6.7 g/dL (ref 6.0–8.5)
eGFR: 108 mL/min/{1.73_m2} (ref >59)

## 2023-07-27 LAB — CBC
Hematocrit: 45.8 % (ref 37.5–51.0)
Hemoglobin: 15.5 g/dL (ref 13.0–17.7)
MCH: 31.7 pg (ref 26.6–33.0)
MCHC: 33.8 g/dL (ref 31.5–35.7)
MCV: 94 fL (ref 79–97)
Platelets: 227 10*3/uL (ref 150–450)
RBC: 4.89 x10E6/uL (ref 4.14–5.80)
RDW: 11.9 % (ref 11.6–15.4)
WBC: 9.2 10*3/uL (ref 3.4–10.8)

## 2023-07-27 LAB — TESTOSTERONE: Testosterone: 357 ng/dL (ref 264–916)

## 2023-07-27 LAB — LIPID PANEL
Chol/HDL Ratio: 3.1 ratio (ref 0.0–5.0)
Cholesterol, Total: 168 mg/dL (ref 100–199)
HDL: 55 mg/dL (ref >39)
LDL Chol Calc (NIH): 102 mg/dL — ABNORMAL HIGH (ref 0–99)
Triglycerides: 57 mg/dL (ref 0–149)
VLDL Cholesterol Cal: 11 mg/dL (ref 5–40)

## 2024-01-31 ENCOUNTER — Encounter: Payer: Self-pay | Admitting: Medical-Surgical

## 2024-01-31 ENCOUNTER — Ambulatory Visit: Admitting: Medical-Surgical

## 2024-01-31 VITALS — BP 125/84 | HR 76 | Resp 20 | Ht 68.0 in | Wt 139.1 lb

## 2024-01-31 DIAGNOSIS — Z789 Other specified health status: Secondary | ICD-10-CM

## 2024-01-31 DIAGNOSIS — F419 Anxiety disorder, unspecified: Secondary | ICD-10-CM

## 2024-01-31 NOTE — Progress Notes (Signed)
 Established Patient Office Visit  Subjective   Patient ID: James Glenn, adult    DOB: 2000/07/27  Age: 23 y.o. MRN: [id number]  Chief Complaint  Patient presents with   Medical Management of Chronic Issues    HRT Follow up    HPI  23 year old male presents for follow up on  Hormone therapy Transgender Male to Male Patient is currently taking testosterone  injection every two weeks self injections. Last injection last Friday. Denies having any side effects to his medications, or s/s of DVT. He is currently satisfied with the results to the testosterone  therapy. Previous lab 07/26/23. Plan to recheck lab today.  Anxiety Patient was started on the Fluoxetine 10 mg daily by his [facility] in July. Patient reports doing well on his current dose. He would like to transfer prescription to this location for management. Current PHQ 9 score 3 and GAD 7 score is a 1.  Review of Systems  Constitutional: Negative.   HENT: Negative.    Eyes: Negative.   Respiratory: Negative.    Cardiovascular: Negative.   Gastrointestinal: Negative.   Genitourinary: Negative.   Musculoskeletal: Negative.   Skin: Negative.   Neurological: Negative.   Endo/Heme/Allergies: Negative.   Psychiatric/Behavioral: Negative.        Objective:     BP 125/84 (BP Location: Left Arm, Cuff Size: Normal)   Pulse 76   Resp 20   Ht 5' 8 (1.727 m)   Wt 63.1 kg   SpO2 100%   BMI 21.15 kg/m  BP Readings from Last 3 Encounters:  01/31/24 125/84  07/23/23 123/76  01/18/23 123/82   Wt Readings from Last 3 Encounters:  01/31/24 63.1 kg  07/23/23 64.9 kg  01/18/23 62.1 kg      Physical Exam Vitals and nursing note reviewed.  Constitutional:      General: He is not in acute distress.    Appearance: Normal appearance.  Cardiovascular:     Rate and Rhythm: Normal rate and regular rhythm.     Pulses: Normal pulses.     Heart sounds: Normal heart sounds.  Pulmonary:     Effort: Pulmonary  effort is normal.     Breath sounds: Normal breath sounds.  Neurological:     General: No focal deficit present.     Mental Status: He is alert and oriented to person, place, and time.  Psychiatric:        Mood and Affect: Mood normal.        Behavior: Behavior normal.        Thought Content: Thought content normal.        Judgment: Judgment normal.      No results found for any visits on 01/31/24.  Last metabolic panel Lab Results  Component Value Date   GLUCOSE 87 07/26/2023   NA 141 07/26/2023   K 4.6 07/26/2023   CL 103 07/26/2023   CO2 23 07/26/2023   BUN 13 07/26/2023   CREATININE 1.01 07/26/2023   EGFR 108 07/26/2023   CALCIUM 9.7 07/26/2023   PROT 6.7 07/26/2023   ALBUMIN 4.7 07/26/2023   LABGLOB 2.0 07/26/2023   BILITOT 0.8 07/26/2023   ALKPHOS 68 07/26/2023   AST 11 07/26/2023   ALT 12 07/26/2023      The ASCVD Risk score (Arnett DK, et al., 2019) failed to calculate for the following reasons:   The 2019 ASCVD risk score is only valid for ages 5 to 10    Assessment &  Plan:   1. 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 Anxiety disorder, unspecified type -Continue with taking fluoxetine 10 mg daily -Refills sent to the pharmacy    Return in about 6 months (around 07/31/2024) for Follow up GAHT.    Derrek JINNY Freund, NP Student

## 2024-01-31 NOTE — Progress Notes (Signed)
 Medical screening examination/treatment was performed by qualified clinical staff member and as supervising provider I was immediately available for consultation/collaboration. I have reviewed documentation and agree with assessment and plan.  Thayer Ohm, DNP, APRN, FNP-BC [facility] and [facility] Medicine

## 2024-02-01 LAB — TESTOSTERONE: Testosterone: 275 ng/dL (ref 264–916)

## 2024-02-02 ENCOUNTER — Ambulatory Visit: Payer: Self-pay | Admitting: Medical-Surgical

## 2024-02-11 ENCOUNTER — Other Ambulatory Visit: Payer: Self-pay

## 2024-02-11 DIAGNOSIS — Z789 Other specified health status: Secondary | ICD-10-CM

## 2024-02-11 MED ORDER — TESTOSTERONE CYPIONATE 200 MG/ML IM SOLN: INTRAMUSCULAR | 5 refills | Status: AC

## 2024-07-31 ENCOUNTER — Ambulatory Visit: Admitting: Medical-Surgical
